# Patient Record
Sex: Male | Born: 1970 | Race: White | Hispanic: No | Marital: Married | Smoking: Never smoker
Health system: Southern US, Community
[De-identification: ages and names within clinical notes are randomized; demographics above are authoritative.]

## PROBLEM LIST (undated history)

## (undated) DIAGNOSIS — F329 Major depressive disorder, single episode, unspecified: Secondary | ICD-10-CM

## (undated) DIAGNOSIS — F32A Depression, unspecified: Secondary | ICD-10-CM

## (undated) DIAGNOSIS — F419 Anxiety disorder, unspecified: Secondary | ICD-10-CM

## (undated) DIAGNOSIS — E7800 Pure hypercholesterolemia, unspecified: Secondary | ICD-10-CM

## (undated) HISTORY — DX: Major depressive disorder, single episode, unspecified: F32.9

## (undated) HISTORY — DX: Depression, unspecified: F32.A

## (undated) HISTORY — DX: Anxiety disorder, unspecified: F41.9

## (undated) HISTORY — PX: HERNIA REPAIR: SHX51

## (undated) HISTORY — DX: Pure hypercholesterolemia, unspecified: E78.00

---

## 2017-10-22 ENCOUNTER — Ambulatory Visit (INDEPENDENT_AMBULATORY_CARE_PROVIDER_SITE_OTHER): Payer: 59 | Admitting: Family Medicine

## 2017-10-22 ENCOUNTER — Encounter: Payer: Self-pay | Admitting: Family Medicine

## 2017-10-22 VITALS — BP 132/80 | HR 69 | Temp 98.4°F | Ht 72.0 in | Wt 210.2 lb

## 2017-10-22 DIAGNOSIS — F419 Anxiety disorder, unspecified: Secondary | ICD-10-CM

## 2017-10-22 DIAGNOSIS — Z0001 Encounter for general adult medical examination with abnormal findings: Secondary | ICD-10-CM

## 2017-10-22 DIAGNOSIS — F325 Major depressive disorder, single episode, in full remission: Secondary | ICD-10-CM | POA: Diagnosis not present

## 2017-10-22 DIAGNOSIS — J309 Allergic rhinitis, unspecified: Secondary | ICD-10-CM

## 2017-10-22 DIAGNOSIS — Z13220 Encounter for screening for lipoid disorders: Secondary | ICD-10-CM

## 2017-10-22 LAB — COMPREHENSIVE METABOLIC PANEL
ALBUMIN: 4.6 g/dL (ref 3.5–5.2)
ALK PHOS: 69 U/L (ref 39–117)
ALT: 40 U/L (ref 0–53)
AST: 37 U/L (ref 0–37)
BUN: 11 mg/dL (ref 6–23)
CO2: 30 mEq/L (ref 19–32)
Calcium: 9.8 mg/dL (ref 8.4–10.5)
Chloride: 104 mEq/L (ref 96–112)
Creatinine, Ser: 1.01 mg/dL (ref 0.40–1.50)
GFR: 84.25 mL/min (ref >60.00)
GLUCOSE: 99 mg/dL (ref 70–99)
POTASSIUM: 4.5 meq/L (ref 3.5–5.1)
Sodium: 140 mEq/L (ref 135–145)
TOTAL PROTEIN: 7 g/dL (ref 6.0–8.3)
Total Bilirubin: 0.5 mg/dL (ref 0.2–1.2)

## 2017-10-22 LAB — CBC
HCT: 46.3 % (ref 39.0–52.0)
HEMOGLOBIN: 15.9 g/dL (ref 13.0–17.0)
MCHC: 34.4 g/dL (ref 30.0–36.0)
MCV: 89.2 fl (ref 78.0–100.0)
PLATELETS: 295 10*3/uL (ref 150.0–400.0)
RBC: 5.19 Mil/uL (ref 4.22–5.81)
RDW: 13.4 % (ref 11.5–15.5)
WBC: 6.1 10*3/uL (ref 4.0–10.5)

## 2017-10-22 LAB — LIPID PANEL
CHOL/HDL RATIO: 9
CHOLESTEROL: 309 mg/dL — AB (ref 0–200)
HDL: 34.2 mg/dL — AB (ref >39.00)
Triglycerides: 601 mg/dL — ABNORMAL HIGH (ref 0.0–149.0)

## 2017-10-22 LAB — TSH: TSH: 1.61 u[IU]/mL (ref 0.35–4.50)

## 2017-10-22 LAB — LDL CHOLESTEROL, DIRECT: Direct LDL: 141 mg/dL

## 2017-10-22 MED ORDER — CITALOPRAM HYDROBROMIDE 20 MG PO TABS: 20.0000 mg | ORAL_TABLET | Freq: Every day | ORAL | 3 refills | Status: DC

## 2017-10-22 NOTE — Progress Notes (Signed)
Subjective:  Danny French is a 47 y.o. male who presents today for his annual comprehensive physical exam and to establish care  HPI:  He has no acute complaints today.   His stable, chronic problems are outlined below:  1. Depression/Anxiety. Several year history.  20 mg daily and tolerates well without side effects. 2. Allergic Rhinitis. Takes claritin 10mg  daily. Occasionally takes OTC nasal sprays.   Lifestyle Diet: No specific diets.  Exercise: No regular exercise.   Depression screen Danny French 2/9 10/22/2017  Decreased Interest 0  Down, Depressed, Hopeless 0  PHQ - 2 Score 0  Altered sleeping 0  Tired, decreased energy 0  Change in appetite 0  Feeling bad or failure about yourself  0  Trouble concentrating 0  Moving slowly or fidgety/restless 0  Suicidal thoughts 0  PHQ-9 Score 0  Difficult doing work/chores Not difficult at all   Health Maintenance Due  Topic Date Due  . HIV Screening  01/26/1986  . INFLUENZA VACCINE  10/07/2017    ROS: Per HPI, otherwise a complete review of systems was negative.   PMH:  The following were reviewed and entered/updated in epic: Past Medical History:  Diagnosis Date  . Anxiety   . Depression   . Elevated cholesterol    Patient Active Problem List   Diagnosis Date Noted  . Depression, major, in remission ([facility]CC) 10/22/2017  . Anxiety 10/22/2017  . Allergic rhinitis 10/22/2017   Past Surgical History:  Procedure Laterality Date  . HERNIA REPAIR     bilateral inguinal   Family History  Problem Relation Age of Onset  . Hypertension Father   . Macular degeneration Father   . Cancer Neg Hx     Medications- reviewed and updated Current Outpatient Medications  Medication Sig Dispense Refill  . citalopram (CELEXA) 20 MG tablet Take 1 tablet (20 mg total) by mouth daily. 90 tablet 3  . loratadine (CLARITIN) 10 MG tablet Take 10 mg by mouth daily.     No current facility-administered medications for this visit.      Allergies-reviewed and updated Allergies  Allergen Reactions  . Cyclosporine Rash    Social History   Socioeconomic History  . Marital status: Married    Spouse name: Not on file  . Number of children: Not on file  . Years of education: Not on file  . Highest education level: Not on file  Occupational History  . Not on file  Social Needs  . Financial resource strain: Not on file  . Food insecurity:    Worry: Not on file    Inability: Not on file  . Transportation needs:    Medical: Not on file    Non-medical: Not on file  Tobacco Use  . Smoking status: Never Smoker  . Smokeless tobacco: Never Used  Substance and Sexual Activity  . Alcohol use: Yes  . Drug use: Never  . Sexual activity: Yes  Lifestyle  . Physical activity:    Days per week: Not on file    Minutes per session: Not on file  . Stress: Not on file  Relationships  . Social connections:    Talks on phone: Not on file    Gets together: Not on file    Attends religious service: Not on file    Active member of club or organization: Not on file    Attends meetings of clubs or organizations: Not on file    Relationship status: Not on file  Other Topics Concern  .  Not on file  Social History Narrative  . Not on file   Objective:  Physical Exam: BP 132/80 (BP Location: Left Arm, Patient Position: Sitting, Cuff Size: Normal)   Pulse 69   Temp 98.4 F (36.9 C) (Oral)   Ht 6' (1.829 m)   Wt 210 lb 3.2 oz (95.3 kg)   SpO2 99%   BMI 28.51 kg/m   Body mass index is 28.51 kg/m. Wt Readings from Last 3 Encounters:  10/22/17 210 lb 3.2 oz (95.3 kg)   Gen: NAD, resting comfortably HEENT: TMs normal bilaterally. OP clear. No thyromegaly noted.  CV: RRR with no murmurs appreciated Pulm: NWOB, CTAB with no crackles, wheezes, or rhonchi GI: Normal bowel sounds present. Soft, Nontender, Nondistended. GU: Normal male external genitalia.  No inguinal hernias noted. MSK: no edema, cyanosis, or clubbing  noted Skin: warm, dry Neuro: CN2-12 grossly intact. Strength 5/5 in upper and lower extremities. Reflexes symmetric and intact bilaterally.  Psych: Normal affect and thought content  Assessment/Plan:  Depression, major, in remission (HCC) Stable.  Continue Celexa 20 mg daily.  Check CBC, CMET, and TSH.  Anxiety Stable.  Continue Celexa 20 mg daily.  Allergic rhinitis Stable.  Continue loratadine 10 mg daily.  Also recommended use of over-the-counter intranasal steroid as needed.   Preventative Healthcare: Check lipid panel.  Patient Counseling(The following topics were reviewed and/or handout was given):  -Nutrition: Stressed importance of moderation in sodium/caffeine intake, saturated fat and cholesterol, caloric balance, sufficient intake of fresh fruits, vegetables, and fiber.  -Stressed the importance of regular exercise.   -Substance Abuse: Discussed cessation/primary prevention of tobacco, alcohol, or other drug use; driving or other dangerous activities under the influence; availability of treatment for abuse.   -Injury prevention: Discussed safety belts, safety helmets, smoke detector, smoking near bedding or upholstery.   -Sexuality: Discussed sexually transmitted diseases, partner selection, use of condoms, avoidance of unintended pregnancy and contraceptive alternatives.   -Dental health: Discussed importance of regular tooth brushing, flossing, and dental visits.  -Health maintenance and immunizations reviewed. Please refer to Health maintenance section.  Return to care in 1 year for next preventative visit.   Katina Degreealeb M. Jimmey RalphParker, MD 10/22/2017 11:50 AM

## 2017-10-22 NOTE — Assessment & Plan Note (Addendum)
Stable.  Continue Celexa 20 mg daily.  Check CBC, CMET, and TSH.

## 2017-10-22 NOTE — Assessment & Plan Note (Signed)
Stable.  Continue loratadine 10 mg daily.  Also recommended use of over-the-counter intranasal steroid as needed.

## 2017-10-22 NOTE — Patient Instructions (Signed)
It was very nice to see you today!  Keep up the good work!  I will refill your celexa today.  Come back to see me in 1 year, or sooner as needed.  Take care, Dr Jerline Pain   Preventive Care 47-64 Years, Male Preventive care refers to lifestyle choices and visits with your health care provider that can promote health and wellness. What does preventive care include?  A yearly physical exam. This is also called an annual well check.  Dental exams once or twice a year.  Routine eye exams. Ask your health care provider how often you should have your eyes checked.  Personal lifestyle choices, including: ? Daily care of your teeth and gums. ? Regular physical activity. ? Eating a healthy diet. ? Avoiding tobacco and drug use. ? Limiting alcohol use. ? Practicing safe sex. ? Taking low-dose aspirin every day starting at age 67. What happens during an annual well check? The services and screenings done by your health care provider during your annual well check will depend on your age, overall health, lifestyle risk factors, and family history of disease. Counseling Your health care provider may ask you questions about your:  Alcohol use.  Tobacco use.  Drug use.  Emotional well-being.  Home and relationship well-being.  Sexual activity.  Eating habits.  Work and work Statistician.  Screening You may have the following tests or measurements:  Height, weight, and BMI.  Blood pressure.  Lipid and cholesterol levels. These may be checked every 5 years, or more frequently if you are over 73 years old.  Skin check.  Lung cancer screening. You may have this screening every year starting at age 2 if you have a 30-pack-year history of smoking and currently smoke or have quit within the past 15 years.  Fecal occult blood test (FOBT) of the stool. You may have this test every year starting at age 22.  Flexible sigmoidoscopy or colonoscopy. You may have a sigmoidoscopy every  5 years or a colonoscopy every 10 years starting at age 58.  Prostate cancer screening. Recommendations will vary depending on your family history and other risks.  Hepatitis C blood test.  Hepatitis B blood test.  Sexually transmitted disease (STD) testing.  Diabetes screening. This is done by checking your blood sugar (glucose) after you have not eaten for a while (fasting). You may have this done every 1-3 years.  Discuss your test results, treatment options, and if necessary, the need for more tests with your health care provider. Vaccines Your health care provider may recommend certain vaccines, such as:  Influenza vaccine. This is recommended every year.  Tetanus, diphtheria, and acellular pertussis (Tdap, Td) vaccine. You may need a Td booster every 10 years.  Varicella vaccine. You may need this if you have not been vaccinated.  Zoster vaccine. You may need this after age 61.  Measles, mumps, and rubella (MMR) vaccine. You may need at least one dose of MMR if you were born in 1957 or later. You may also need a second dose.  Pneumococcal 13-valent conjugate (PCV13) vaccine. You may need this if you have certain conditions and have not been vaccinated.  Pneumococcal polysaccharide (PPSV23) vaccine. You may need one or two doses if you smoke cigarettes or if you have certain conditions.  Meningococcal vaccine. You may need this if you have certain conditions.  Hepatitis A vaccine. You may need this if you have certain conditions or if you travel or work in places where you may  be exposed to hepatitis A.  Hepatitis B vaccine. You may need this if you have certain conditions or if you travel or work in places where you may be exposed to hepatitis B.  Haemophilus influenzae type b (Hib) vaccine. You may need this if you have certain risk factors.  Talk to your health care provider about which screenings and vaccines you need and how often you need them. This information is  not intended to replace advice given to you by your health care provider. Make sure you discuss any questions you have with your health care provider. Document Released: 03/22/2015 Document Revised: 11/13/2015 Document Reviewed: 12/25/2014 Elsevier Interactive Patient Education  [employer].

## 2017-10-22 NOTE — Assessment & Plan Note (Signed)
Stable.  Continue Celexa 20 mg daily. 

## 2017-10-26 ENCOUNTER — Encounter: Payer: Self-pay | Admitting: Family Medicine

## 2017-10-26 DIAGNOSIS — E785 Hyperlipidemia, unspecified: Secondary | ICD-10-CM | POA: Insufficient documentation

## 2017-10-26 NOTE — Progress Notes (Signed)
Please inform patient of the following:  Blood counts are normal.  Electrolytes, kidney function, liver function, and blood sugar levels are normal. Thyroid level is normal. His triglycerides are very high, his "bad" cholesterol is high, and his "good" cholesterol is low. He would benefit from starting lipitor 40mg  daily to improve his numbers and lower his risk of heart attack and stroke. Would like for him to come back in 6-12 months to recheck.  Katina Degreealeb M. Jimmey RalphParker, MD 10/26/2017 7:57 AM

## 2017-11-04 ENCOUNTER — Other Ambulatory Visit: Payer: Self-pay | Admitting: *Deleted

## 2017-11-04 MED ORDER — ATORVASTATIN CALCIUM 40 MG PO TABS: 40.0000 mg | ORAL_TABLET | Freq: Every day | ORAL | 1 refills | Status: DC

## 2018-04-09 ENCOUNTER — Encounter: Payer: Self-pay | Admitting: Family Medicine

## 2018-04-11 ENCOUNTER — Other Ambulatory Visit: Payer: Self-pay

## 2018-04-11 MED ORDER — LORATADINE 10 MG PO TABS: 10.0000 mg | ORAL_TABLET | Freq: Every day | ORAL | 3 refills | Status: AC

## 2018-05-04 ENCOUNTER — Other Ambulatory Visit: Payer: Self-pay | Admitting: Family Medicine

## 2018-05-06 ENCOUNTER — Other Ambulatory Visit: Payer: Self-pay

## 2018-05-06 MED ORDER — ATORVASTATIN CALCIUM 40 MG PO TABS: 40.0000 mg | ORAL_TABLET | Freq: Every day | ORAL | 1 refills | Status: DC

## 2018-10-07 ENCOUNTER — Other Ambulatory Visit: Payer: Self-pay | Admitting: Family Medicine

## 2018-11-01 ENCOUNTER — Encounter: Payer: Self-pay | Admitting: Family Medicine

## 2018-11-01 ENCOUNTER — Other Ambulatory Visit: Payer: Self-pay

## 2018-11-01 ENCOUNTER — Ambulatory Visit (INDEPENDENT_AMBULATORY_CARE_PROVIDER_SITE_OTHER): Payer: 59 | Admitting: Family Medicine

## 2018-11-01 VITALS — BP 125/86 | HR 78 | Temp 98.2°F | Ht 72.0 in | Wt 208.2 lb

## 2018-11-01 DIAGNOSIS — F419 Anxiety disorder, unspecified: Secondary | ICD-10-CM | POA: Diagnosis not present

## 2018-11-01 DIAGNOSIS — Z0001 Encounter for general adult medical examination with abnormal findings: Secondary | ICD-10-CM

## 2018-11-01 DIAGNOSIS — F325 Major depressive disorder, single episode, in full remission: Secondary | ICD-10-CM | POA: Diagnosis not present

## 2018-11-01 DIAGNOSIS — J309 Allergic rhinitis, unspecified: Secondary | ICD-10-CM

## 2018-11-01 DIAGNOSIS — E785 Hyperlipidemia, unspecified: Secondary | ICD-10-CM

## 2018-11-01 DIAGNOSIS — H938X2 Other specified disorders of left ear: Secondary | ICD-10-CM

## 2018-11-01 LAB — CBC
HCT: 44.7 % (ref 39.0–52.0)
Hemoglobin: 15.2 g/dL (ref 13.0–17.0)
MCHC: 34.1 g/dL (ref 30.0–36.0)
MCV: 89 fl (ref 78.0–100.0)
Platelets: 268 10*3/uL (ref 150.0–400.0)
RBC: 5.02 Mil/uL (ref 4.22–5.81)
RDW: 13.2 % (ref 11.5–15.5)
WBC: 7.6 10*3/uL (ref 4.0–10.5)

## 2018-11-01 LAB — COMPREHENSIVE METABOLIC PANEL
ALT: 49 U/L (ref 0–53)
AST: 26 U/L (ref 0–37)
Albumin: 4.8 g/dL (ref 3.5–5.2)
Alkaline Phosphatase: 70 U/L (ref 39–117)
BUN: 11 mg/dL (ref 6–23)
CO2: 27 mEq/L (ref 19–32)
Calcium: 9.6 mg/dL (ref 8.4–10.5)
Chloride: 102 mEq/L (ref 96–112)
Creatinine, Ser: 0.96 mg/dL (ref 0.40–1.50)
GFR: 83.68 mL/min (ref >60.00)
Glucose, Bld: 101 mg/dL — ABNORMAL HIGH (ref 70–99)
Potassium: 4.2 mEq/L (ref 3.5–5.1)
Sodium: 139 mEq/L (ref 135–145)
Total Bilirubin: 0.7 mg/dL (ref 0.2–1.2)
Total Protein: 6.8 g/dL (ref 6.0–8.3)

## 2018-11-01 LAB — LIPID PANEL
Cholesterol: 174 mg/dL (ref 0–200)
HDL: 37.5 mg/dL — ABNORMAL LOW (ref >39.00)
NonHDL: 136.09
Total CHOL/HDL Ratio: 5
Triglycerides: 238 mg/dL — ABNORMAL HIGH (ref 0.0–149.0)
VLDL: 47.6 mg/dL — ABNORMAL HIGH (ref 0.0–40.0)

## 2018-11-01 LAB — TSH: TSH: 1.52 u[IU]/mL (ref 0.35–4.50)

## 2018-11-01 LAB — LDL CHOLESTEROL, DIRECT: Direct LDL: 90 mg/dL

## 2018-11-01 MED ORDER — CITALOPRAM HYDROBROMIDE 40 MG PO TABS: 40.0000 mg | ORAL_TABLET | Freq: Every day | ORAL | 3 refills | Status: DC

## 2018-11-01 MED ORDER — ATORVASTATIN CALCIUM 40 MG PO TABS: 40.0000 mg | ORAL_TABLET | Freq: Every day | ORAL | 3 refills | Status: DC

## 2018-11-01 NOTE — Assessment & Plan Note (Signed)
-   Continue Claritin 10mg daily 

## 2018-11-01 NOTE — Patient Instructions (Signed)
It was very nice to see you today!  Please increase your Celexa to 40 mg daily.  I will send in a new prescription for you.  We will check blood work today.  We will get an ultrasound of the lump on your left ear.  Come back to see me in 1 year for your next physical, or sooner if needed.  Take care, Dr Jerline Pain  Please try these tips to maintain a healthy lifestyle:   Eat at least 3 REAL meals and 1-2 snacks per day.  Aim for no more than 5 hours between eating.  If you eat breakfast, please do so within one hour of getting up.    Obtain twice as many fruits/vegetables as protein or carbohydrate foods for both lunch and dinner. (Half of each meal should be fruits/vegetables, one quarter protein, and one quarter starchy carbs)   Cut down on sweet beverages. This includes juice, soda, and sweet tea.    Exercise at least 150 minutes every week.    Preventive Care 38-76 Years Old, Male Preventive care refers to lifestyle choices and visits with your health care provider that can promote health and wellness. This includes:  A yearly physical exam. This is also called an annual well check.  Regular dental and eye exams.  Immunizations.  Screening for certain conditions.  Healthy lifestyle choices, such as eating a healthy diet, getting regular exercise, not using drugs or products that contain nicotine and tobacco, and limiting alcohol use. What can I expect for my preventive care visit? Physical exam Your health care provider will check:  Height and weight. These may be used to calculate body mass index (BMI), which is a measurement that tells if you are at a healthy weight.  Heart rate and blood pressure.  Your skin for abnormal spots. Counseling Your health care provider may ask you questions about:  Alcohol, tobacco, and drug use.  Emotional well-being.  Home and relationship well-being.  Sexual activity.  Eating habits.  Work and work Statistician. What  immunizations do I need?  Influenza (flu) vaccine  This is recommended every year. Tetanus, diphtheria, and pertussis (Tdap) vaccine  You may need a Td booster every 10 years. Varicella (chickenpox) vaccine  You may need this vaccine if you have not already been vaccinated. Zoster (shingles) vaccine  You may need this after age 5. Measles, mumps, and rubella (MMR) vaccine  You may need at least one dose of MMR if you were born in 1957 or later. You may also need a second dose. Pneumococcal conjugate (PCV13) vaccine  You may need this if you have certain conditions and were not previously vaccinated. Pneumococcal polysaccharide (PPSV23) vaccine  You may need one or two doses if you smoke cigarettes or if you have certain conditions. Meningococcal conjugate (MenACWY) vaccine  You may need this if you have certain conditions. Hepatitis A vaccine  You may need this if you have certain conditions or if you travel or work in places where you may be exposed to hepatitis A. Hepatitis B vaccine  You may need this if you have certain conditions or if you travel or work in places where you may be exposed to hepatitis B. Haemophilus influenzae type b (Hib) vaccine  You may need this if you have certain risk factors. Human papillomavirus (HPV) vaccine  If recommended by your health care provider, you may need three doses over 6 months. You may receive vaccines as individual doses or as more than one vaccine  together in one shot (combination vaccines). Talk with your health care provider about the risks and benefits of combination vaccines. What tests do I need? Blood tests  Lipid and cholesterol levels. These may be checked every 5 years, or more frequently if you are over 8 years old.  Hepatitis C test.  Hepatitis B test. Screening  Lung cancer screening. You may have this screening every year starting at age 28 if you have a 30-pack-year history of smoking and currently smoke  or have quit within the past 15 years.  Prostate cancer screening. Recommendations will vary depending on your family history and other risks.  Colorectal cancer screening. All adults should have this screening starting at age 12 and continuing until age 31. Your health care provider may recommend screening at age 23 if you are at increased risk. You will have tests every 1-10 years, depending on your results and the type of screening test.  Diabetes screening. This is done by checking your blood sugar (glucose) after you have not eaten for a while (fasting). You may have this done every 1-3 years.  Sexually transmitted disease (STD) testing. Follow these instructions at home: Eating and drinking  Eat a diet that includes fresh fruits and vegetables, whole grains, lean protein, and low-fat dairy products.  Take vitamin and mineral supplements as recommended by your health care provider.  Do not drink alcohol if your health care provider tells you not to drink.  If you drink alcohol: ? Limit how much you have to 0-2 drinks a day. ? Be aware of how much alcohol is in your drink. In the [place] one drink equals one 12 oz bottle of beer (355 mL), one 5 oz glass of wine (148 mL), or one 1 oz glass of hard liquor (44 mL). Lifestyle  Take daily care of your teeth and gums.  Stay active. Exercise for at least 30 minutes on 5 or more days each week.  Do not use any products that contain nicotine or tobacco, such as cigarettes, e-cigarettes, and chewing tobacco. If you need help quitting, ask your health care provider.  If you are sexually active, practice safe sex. Use a condom or other form of protection to prevent STIs (sexually transmitted infections).  Talk with your health care provider about taking a low-dose aspirin every day starting at age 3. What's next?  Go to your health care provider once a year for a well check visit.  Ask your health care provider how often you should have  your eyes and teeth checked.  Stay up to date on all vaccines. This information is not intended to replace advice given to you by your health care provider. Make sure you discuss any questions you have with your health care provider. Document Released: 03/22/2015 Document Revised: 02/17/2018 Document Reviewed: 02/17/2018 Elsevier Patient Education  2020 [employer].

## 2018-11-01 NOTE — Progress Notes (Signed)
Chief Complaint:  Danny French is a 48 y.o. male who presents today for his annual comprehensive physical exam.    Assessment/Plan:  Dyslipidemia Check lipid panel.  Continue Lipitor 40 mg daily.  Allergic rhinitis Continue Claritin 10 mg daily.  Anxiety Increase Celexa to 40 mg daily.  Depression, major, in remission ([place]) Increase Celexa to 40 mg daily.  Discussed potential side effects.  Ear Lump Likely sebaceous cyst.  Possibly could be cartilaginous in nature also.  Will check ultrasound to further examine given that seems to be increasing in size and is becoming more symptomatic.  Body mass index is 28.24 kg/m. / Overweight BMI Metric Follow Up - 11/01/18 1337      BMI Metric Follow Up-Please document annually   BMI Metric Follow Up  Education provided        Preventative Healthcare: Flu vaccine later this year.  Check CBC, C met, TSH, and lipid panel.  Patient Counseling(The following topics were reviewed and/or handout was given):  -Nutrition: Stressed importance of moderation in sodium/caffeine intake, saturated fat and cholesterol, caloric balance, sufficient intake of fresh fruits, vegetables, and fiber.  -Stressed the importance of regular exercise.   -Substance Abuse: Discussed cessation/primary prevention of tobacco, alcohol, or other drug use; driving or other dangerous activities under the influence; availability of treatment for abuse.   -Injury prevention: Discussed safety belts, safety helmets, smoke detector, smoking near bedding or upholstery.   -Sexuality: Discussed sexually transmitted diseases, partner selection, use of condoms, avoidance of unintended pregnancy and contraceptive alternatives.   -Dental health: Discussed importance of regular tooth brushing, flossing, and dental visits.  -Health maintenance and immunizations reviewed. Please refer to Health maintenance section.  Return to care in 1 year for next preventative visit.      Subjective:  HPI:  He has no acute complaints today.   Over the last 34 years he has noticed a lump on his left earlobe.  This was previously evaluated by his previous primary care physician and was told that it was a piece of cartilage.  Over the last couple of months he has noticed that it seems to be increasing in size and becoming more painful.  No drainage from the area.  No obvious aggravating factors.  His stable, chronic medical conditions are outlined below:   # Depression / Anxiety - On celexa 96m daily and tolerating well - Worsened recently due to covid 19. Interested in increasing dose.  - ROS: No reported SI or HI  # Dyslipidemia - On lipitor 420mdaily and tolerating well - ROS: No reported mylagias  # Allergic Rhinitis - On claritin 101maily and takes flonase as needed  Lifestyle Diet: No specific diets.  Exercise: No specific exercises or workout plans.   [facility]  Decreased Interest 0  Down, Depressed, Hopeless 1  PHQ - 2 Score 1  Altered sleeping 0  Tired, decreased energy 1  Change in appetite 0  Feeling bad or failure about yourself  0  Trouble concentrating 0  Moving slowly or fidgety/restless 0  Suicidal thoughts 0  PHQ-9 Score 2  Difficult doing work/chores Not difficult at all    Health Maintenance Due  Topic Date Due  . HIV Screening  01/26/1986     ROS: Per HPI, otherwise a complete review of systems was negative.   PMH:  The following were reviewed and entered/updated in epic: Past Medical History:  Diagnosis Date  . Anxiety   . Depression   .  Elevated cholesterol    Patient Active Problem List   Diagnosis Date Noted  . Dyslipidemia 10/26/2017  . Depression, major, in remission ([place]) 10/22/2017  . Anxiety 10/22/2017  . Allergic rhinitis 10/22/2017   Past Surgical History:  Procedure Laterality Date  . HERNIA REPAIR     bilateral inguinal    Family History  Problem Relation Age of Onset  .  Hypertension Father   . Macular degeneration Father   . Cancer Neg Hx     Medications- reviewed and updated Current Outpatient Medications  Medication Sig Dispense Refill  . atorvastatin (LIPITOR) 40 MG tablet Take 1 tablet (40 mg total) by mouth daily. 90 tablet 3  . citalopram (CELEXA) 40 MG tablet Take 1 tablet (40 mg total) by mouth daily. 90 tablet 3  . loratadine (CLARITIN) 10 MG tablet Take 1 tablet (10 mg total) by mouth daily. 30 tablet 3   No current facility-administered medications for this visit.     Allergies-reviewed and updated Allergies  Allergen Reactions  . Cyclosporine Rash    Social History   Socioeconomic History  . Marital status: Married    Spouse name: Not on file  . Number of children: Not on file  . Years of education: Not on file  . Highest education level: Not on file  Occupational History  . Not on file  Social Needs  . Financial resource strain: Not on file  . Food insecurity    Worry: Not on file    Inability: Not on file  . Transportation needs    Medical: Not on file    Non-medical: Not on file  Tobacco Use  . Smoking status: Never Smoker  . Smokeless tobacco: Never Used  Substance and Sexual Activity  . Alcohol use: Yes  . Drug use: Never  . Sexual activity: Yes  Lifestyle  . Physical activity    Days per week: Not on file    Minutes per session: Not on file  . Stress: Not on file  Relationships  . Social Herbalist on phone: Not on file    Gets together: Not on file    Attends religious service: Not on file    Active member of club or organization: Not on file    Attends meetings of clubs or organizations: Not on file    Relationship status: Not on file  Other Topics Concern  . Not on file  Social History Narrative  . Not on file        Objective:  Physical Exam: BP 125/86   Pulse 78   Temp 98.2 F (36.8 C)   Ht 6' (1.829 m)   Wt 208 lb 4 oz (94.5 kg)   SpO2 99%   BMI 28.24 kg/m   Body mass  index is 28.24 kg/m. Wt Readings from Last 3 Encounters:  11/01/18 208 lb 4 oz (94.5 kg)  10/22/17 210 lb 3.2 oz (95.3 kg)   Gen: NAD, resting comfortably HEENT: TMs normal bilaterally. OP clear. No thyromegaly noted.  CV: RRR with no murmurs appreciated Pulm: NWOB, CTAB with no crackles, wheezes, or rhonchi GI: Normal bowel sounds present. Soft, Nontender, Nondistended. MSK: no edema, cyanosis, or clubbing noted Skin: warm, dry.  Mobile mass on left earlobe approximately 5 mm in diameter. Neuro: CN2-12 grossly intact. Strength 5/5 in upper and lower extremities. Reflexes symmetric and intact bilaterally.  Psych: Normal affect and thought content     Michaline Kindig M. Jerline Pain, MD 11/01/2018 1:39 PM

## 2018-11-01 NOTE — Assessment & Plan Note (Signed)
Increase Celexa to 40 mg daily.

## 2018-11-01 NOTE — Assessment & Plan Note (Signed)
Increase Celexa to 40 mg daily.  Discussed potential side effects.

## 2018-11-01 NOTE — Assessment & Plan Note (Signed)
Check lipid panel.  Continue Lipitor 40 mg daily. 

## 2018-11-02 ENCOUNTER — Other Ambulatory Visit: Payer: Self-pay | Admitting: Family Medicine

## 2018-11-02 ENCOUNTER — Encounter: Payer: Self-pay | Admitting: Family Medicine

## 2018-11-02 DIAGNOSIS — R739 Hyperglycemia, unspecified: Secondary | ICD-10-CM | POA: Insufficient documentation

## 2018-11-02 DIAGNOSIS — H938X2 Other specified disorders of left ear: Secondary | ICD-10-CM

## 2018-11-02 NOTE — Progress Notes (Signed)
Please inform patient of the following:  Good news! Cholesterol numbers are much better than last year. His blood sugar was up just a little compared to last year but the rest of his labs were all NORMAL. Do not need to make any changes to his treatment plan at this time.  Would like for him to keep up the good work and we can recheck in a year or so.  Danny French. Jerline Pain, MD 11/02/2018 8:04 AM

## 2018-11-07 ENCOUNTER — Encounter: Payer: Self-pay | Admitting: Family Medicine

## 2018-11-07 ENCOUNTER — Other Ambulatory Visit: Payer: Self-pay

## 2018-11-07 DIAGNOSIS — H938X2 Other specified disorders of left ear: Secondary | ICD-10-CM

## 2018-11-25 ENCOUNTER — Encounter: Payer: Self-pay | Admitting: Family Medicine

## 2018-11-28 ENCOUNTER — Other Ambulatory Visit: Payer: Self-pay | Admitting: Family Medicine

## 2018-11-28 DIAGNOSIS — H938X2 Other specified disorders of left ear: Secondary | ICD-10-CM

## 2018-12-13 ENCOUNTER — Other Ambulatory Visit: Payer: Self-pay

## 2018-12-13 ENCOUNTER — Ambulatory Visit (HOSPITAL_COMMUNITY)
Admission: RE | Admit: 2018-12-13 | Discharge: 2018-12-13 | Disposition: A | Discharge status: Home / Self Care | Payer: 59 | Source: Ambulatory Visit | Attending: Family Medicine | Admitting: Family Medicine

## 2018-12-13 DIAGNOSIS — H938X2 Other specified disorders of left ear: Secondary | ICD-10-CM | POA: Diagnosis not present

## 2018-12-15 DIAGNOSIS — H938X2 Other specified disorders of left ear: Secondary | ICD-10-CM

## 2018-12-15 NOTE — Progress Notes (Signed)
Please inform patient of the following:  His ultrasound was inconclusive. Recommend ENT referral for further evaluation. Please place referral for patient.  Danny French. Jerline Pain, MD 12/15/2018 8:01 AM

## 2018-12-15 NOTE — Progress Notes (Signed)
This encounter was created in error - please disregard.

## 2019-01-02 ENCOUNTER — Other Ambulatory Visit: Payer: Self-pay | Admitting: Family Medicine

## 2019-11-28 ENCOUNTER — Other Ambulatory Visit: Payer: Self-pay | Admitting: Family Medicine

## 2019-12-30 ENCOUNTER — Other Ambulatory Visit: Payer: Self-pay | Admitting: Family Medicine

## 2020-08-03 ENCOUNTER — Other Ambulatory Visit: Payer: Self-pay | Admitting: Family Medicine

## 2020-11-08 ENCOUNTER — Other Ambulatory Visit: Payer: Self-pay | Admitting: Family Medicine

## 2020-11-13 IMAGING — US US SOFT TISSUE HEAD/NECK
1 series · 10 of 10 positions shown · non-contrast
Comparison: None.

CLINICAL DATA: Left ear mass

EXAM:
ULTRASOUND OF HEAD/NECK SOFT TISSUES
TECHNIQUE: Ultrasound examination of the head and neck soft tissues was
performed in the area of clinical concern.

[Series 1: us soft tissue head/neck · 10 acquisitions, 10 frames shown]
[im 1/10]
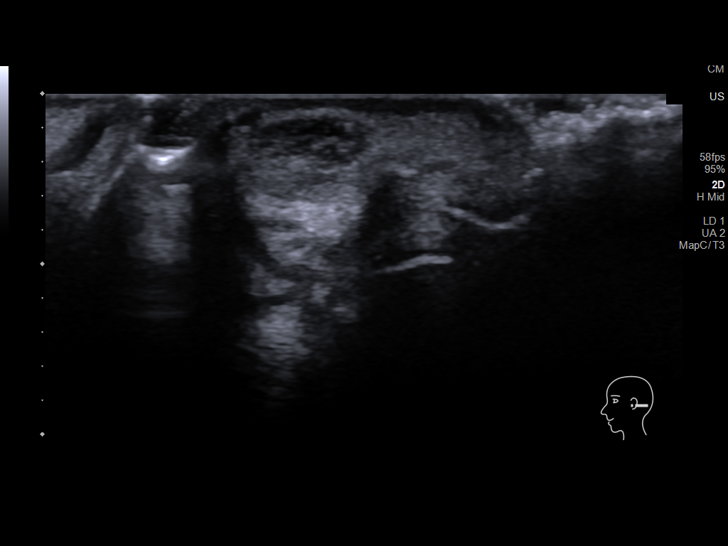
[im 2/10]
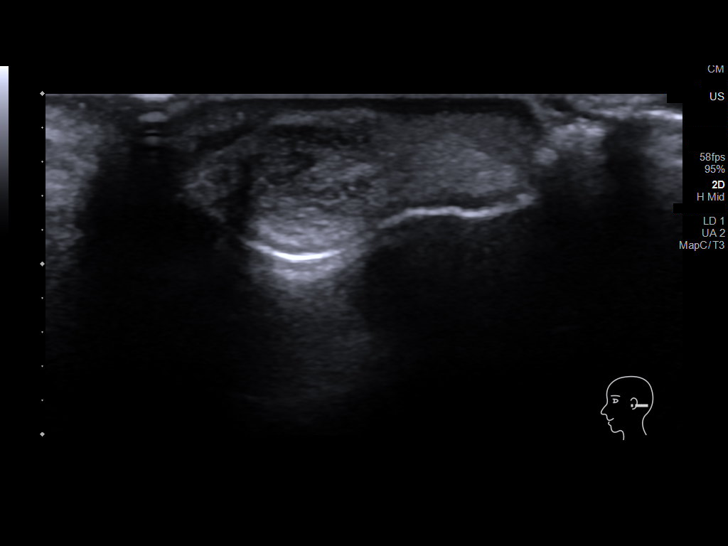
[im 3/10]
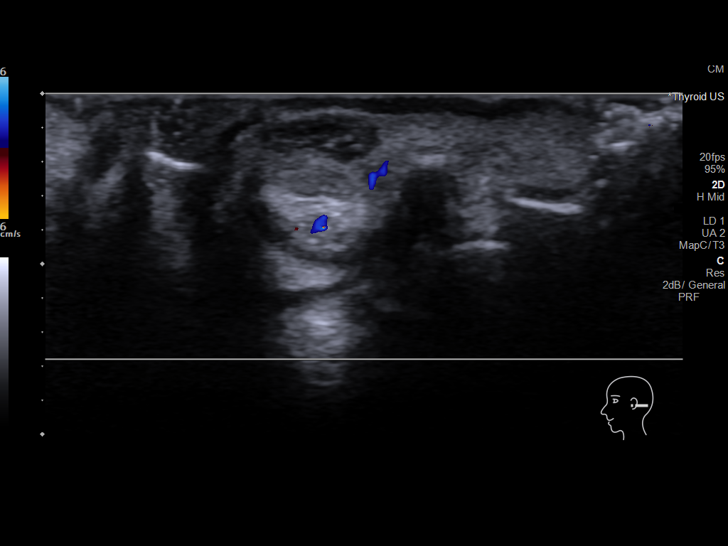
[im 4/10]
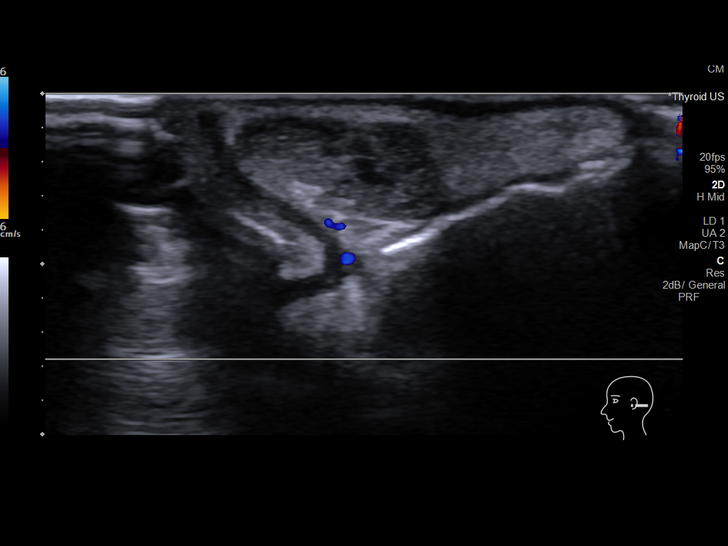
[im 5/10]
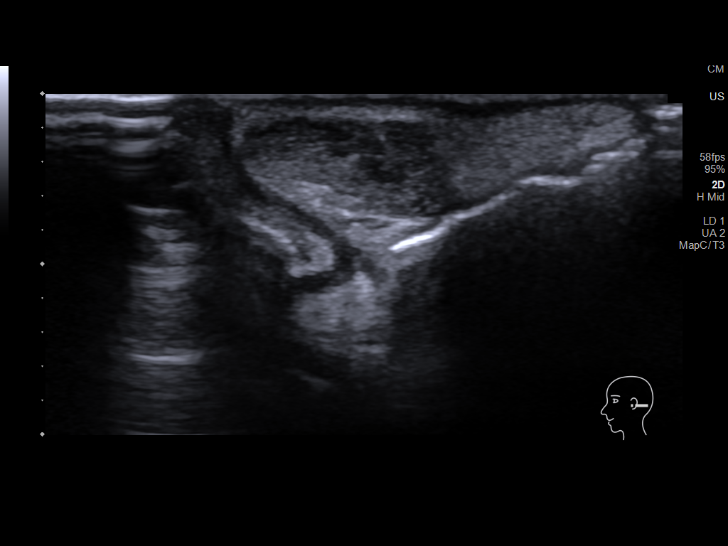
[im 6/10]
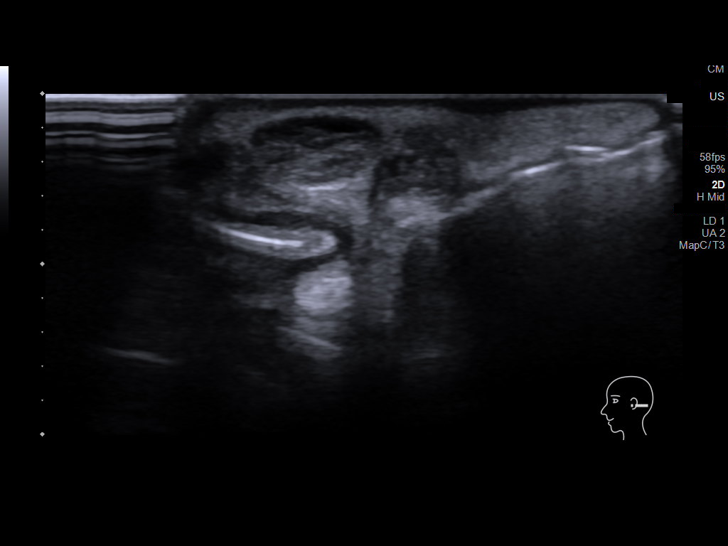
[im 7/10]
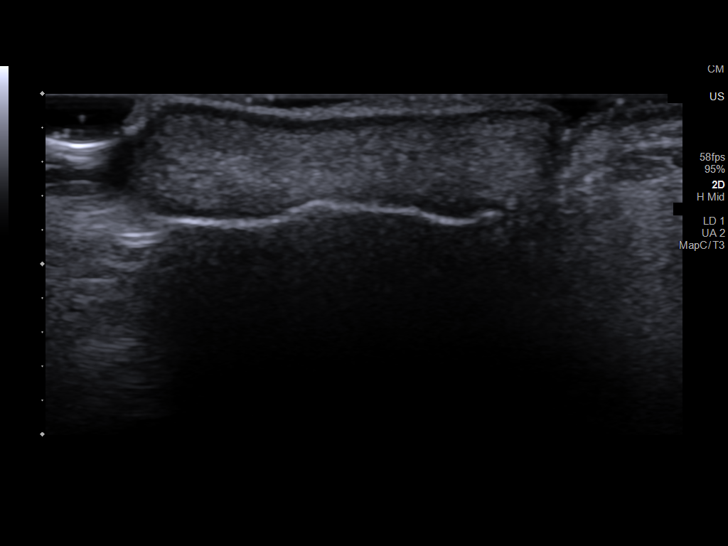
[im 8/10]
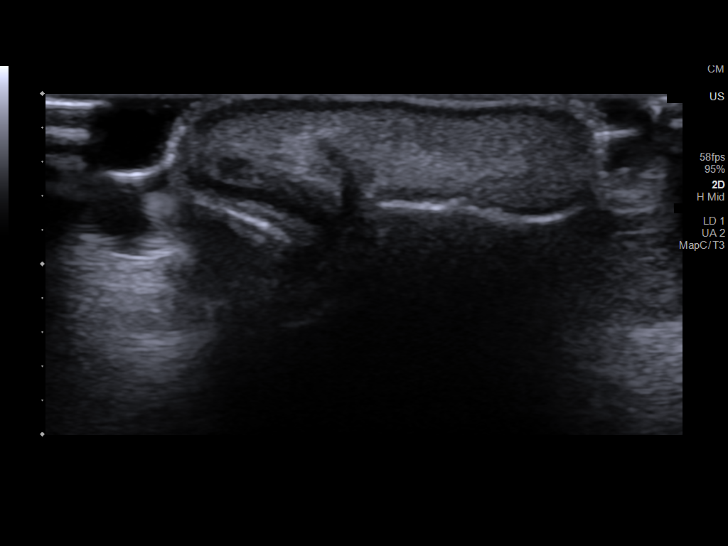
[im 9/10]
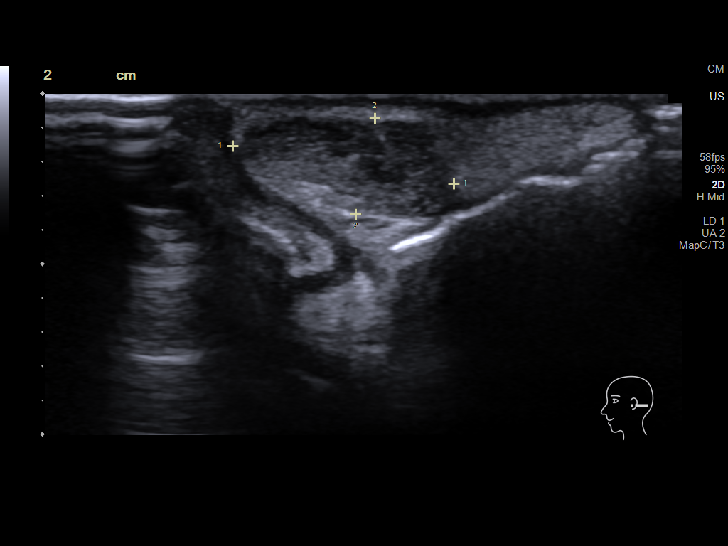
[im 10/10]
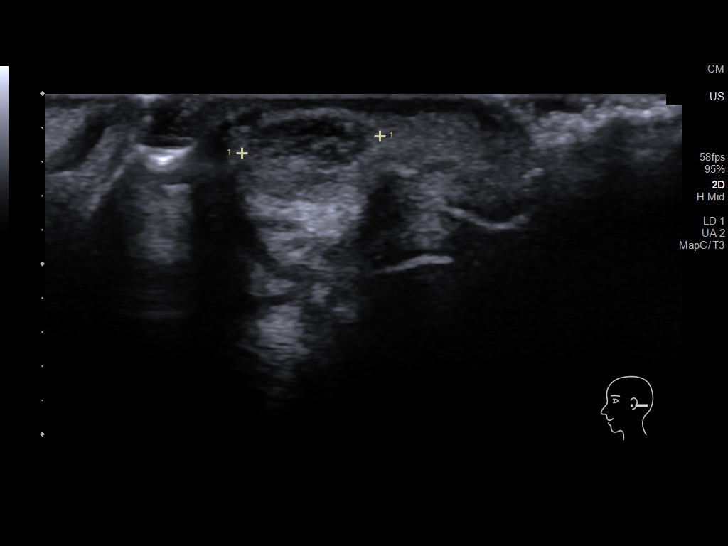

[10 of 10 positions shown; findings below may reference images not displayed]

FINDINGS: 13 mm heterogeneous mass in the left ear superficial to the
echogenic cartilage. This is at the level of the ear lobe based on
media images. No internal color Doppler flow is seen. The mass could
be solid or complex cystic.
IMPRESSION: 13 mm ear lobe mass which could be solid or complex cystic. No
specific/diagnostic imaging features.

## 2021-03-31 ENCOUNTER — Other Ambulatory Visit: Payer: Self-pay | Admitting: Family Medicine

## 2021-12-01 ENCOUNTER — Encounter: Payer: Self-pay | Admitting: *Deleted

## 2022-02-19 ENCOUNTER — Encounter: Payer: Self-pay | Admitting: *Deleted
